# Patient Record
Sex: Male | Born: 2000 | Race: White | Hispanic: No | Marital: Single | Smoking: Current some day smoker
Health system: Southern US, Community
[De-identification: ages and names within clinical notes are randomized; demographics above are authoritative.]

## PROBLEM LIST (undated history)

## (undated) DIAGNOSIS — C799 Secondary malignant neoplasm of unspecified site: Secondary | ICD-10-CM

## (undated) DIAGNOSIS — C439 Malignant melanoma of skin, unspecified: Secondary | ICD-10-CM

## (undated) DIAGNOSIS — T7840XA Allergy, unspecified, initial encounter: Secondary | ICD-10-CM

## (undated) DIAGNOSIS — T4145XA Adverse effect of unspecified anesthetic, initial encounter: Secondary | ICD-10-CM

## (undated) DIAGNOSIS — R112 Nausea with vomiting, unspecified: Secondary | ICD-10-CM

## (undated) DIAGNOSIS — T8859XA Other complications of anesthesia, initial encounter: Secondary | ICD-10-CM

## (undated) DIAGNOSIS — T884XXA Failed or difficult intubation, initial encounter: Secondary | ICD-10-CM

## (undated) DIAGNOSIS — Z9889 Other specified postprocedural states: Secondary | ICD-10-CM

## (undated) HISTORY — PX: CYST REMOVAL WITH BONE GRAFT: SHX6365

---
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
## 2020-11-13 IMAGING — NM NUCLEAR MEDICINE LYMPHANGIOGRAPHY
8 series · 8 of 8 positions shown · non-contrast
Comparison: PET-CT scan 05/09/2018

CLINICAL DATA: Melanoma of the scalp.  Sent 9 mapping

EXAM:
NUCLEAR MEDICINE LYMPHANGIOGRAPHY
TECHNIQUE: Sequential images were obtained following intradermal injection of
radiopharmaceutical at the tumor site in the RIGHT scalp.
RADIOPHARMACEUTICALS:  0.5 mCi millipore-filtered Ic-MMm sulfur
colloid

[Series 1: lymph ant/post · 2.07mm/px · 1 of 1 slices shown]
[im 1/1]
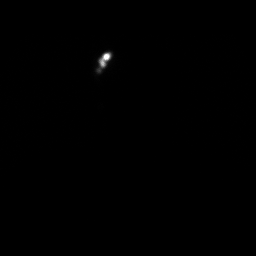

[Series 2: lymph ant/post 2 · 2.07mm/px · 1 of 1 slices shown]
[im 1/1]
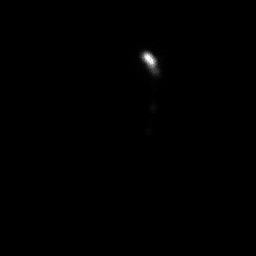

[Series 3: lymph lats · 2.07mm/px · 1 of 1 slices shown]
[im 1/1]
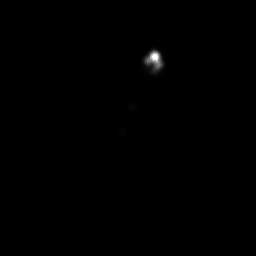

[Series 4: lymph ant/post 3 · 2.07mm/px · 1 of 1 slices shown]
[im 1/1]
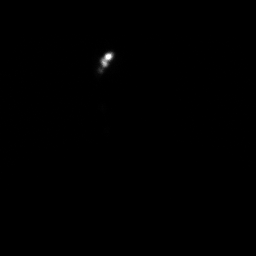

[Series 5: lymph ant/post 4 · 2.07mm/px · 1 of 1 slices shown]
[im 1/1]
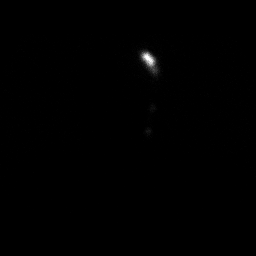

[Series 6: lymph lats 2 · 2.07mm/px · 1 of 1 slices shown]
[im 1/1]
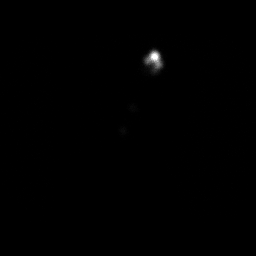

[Series 7: lymph ant/post 5 · 2.07mm/px · 1 of 1 slices shown]
[im 1/1  full-range]
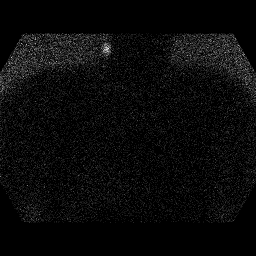

[Series 8: lymph ant/post 6 · 2.07mm/px · 1 of 1 slices shown]
[im 1/1  full-range]
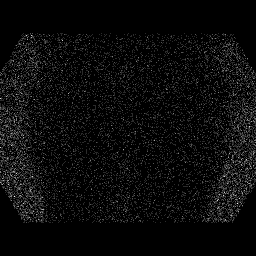

[8 of 8 positions shown; findings below may reference images not displayed]

FINDINGS: Injection site noted in the RIGHT scalp. Lymph channel noted in the
RIGHT lateral neck with two lymph nodes identified along the
channel. One node at the level of the angle of the jaw. The second
more inferior node is RIGHT lower neck superior to the
supraclavicular nodal station. No axillary activity.
IMPRESSION: Two lymph nodes map in the RIGHT lateral neck.

## 2021-02-21 ENCOUNTER — Emergency Department (HOSPITAL_COMMUNITY)
Admission: EM | Admit: 2021-02-21 | Discharge: 2021-02-21 | Discharge status: Against Medical Advice | Payer: Medicaid Other | Source: Home / Self Care

## 2021-02-21 ENCOUNTER — Encounter: Payer: Self-pay | Admitting: Oncology

## 2021-02-22 ENCOUNTER — Emergency Department (HOSPITAL_COMMUNITY): Payer: Medicaid Other

## 2021-02-22 ENCOUNTER — Emergency Department (HOSPITAL_COMMUNITY)
Admission: EM | Admit: 2021-02-22 | Discharge: 2021-02-22 | Disposition: A | Discharge status: Home / Self Care | Payer: Medicaid Other | Attending: Emergency Medicine | Admitting: Emergency Medicine

## 2021-02-22 ENCOUNTER — Encounter (HOSPITAL_COMMUNITY): Payer: Self-pay

## 2021-02-22 DIAGNOSIS — Z79899 Other long term (current) drug therapy: Secondary | ICD-10-CM | POA: Diagnosis not present

## 2021-02-22 DIAGNOSIS — J029 Acute pharyngitis, unspecified: Secondary | ICD-10-CM | POA: Diagnosis not present

## 2021-02-22 DIAGNOSIS — Z85820 Personal history of malignant melanoma of skin: Secondary | ICD-10-CM | POA: Insufficient documentation

## 2021-02-22 DIAGNOSIS — R590 Localized enlarged lymph nodes: Secondary | ICD-10-CM | POA: Insufficient documentation

## 2021-02-22 DIAGNOSIS — F17210 Nicotine dependence, cigarettes, uncomplicated: Secondary | ICD-10-CM | POA: Diagnosis not present

## 2021-02-22 LAB — I-STAT CHEM 8, ED
BUN: 12 mg/dL (ref 6–20)
Calcium, Ion: 1.19 mmol/L (ref 1.15–1.40)
Chloride: 101 mmol/L (ref 98–111)
Creatinine, Ser: 0.8 mg/dL (ref 0.61–1.24)
Glucose, Bld: 92 mg/dL (ref 70–99)
HCT: 48 % (ref 39.0–52.0)
Hemoglobin: 16.3 g/dL (ref 13.0–17.0)
Potassium: 4.1 mmol/L (ref 3.5–5.1)
Sodium: 137 mmol/L (ref 135–145)
TCO2: 27 mmol/L (ref 22–32)

## 2021-02-22 MED ORDER — IOHEXOL 350 MG/ML SOLN
80.0000 mL | Freq: Once | INTRAVENOUS | Status: AC | PRN
  Administered 2021-02-22: 12:00:00 80 mL via INTRAVENOUS

## 2021-02-22 MED ORDER — SODIUM CHLORIDE (PF) 0.9 % IJ SOLN
INTRAMUSCULAR | Status: AC
  Filled 2021-02-22: qty 50

## 2021-02-22 NOTE — Discharge Instructions (Addendum)
The CT scans show you have 1 lymph node in your lungs as well as multiple lymph nodes in the right side of your neck.  You will need to follow-up with the ear nose throat specialist you saw before as well as your oncologist.  Please call them in the next 24 hours to set up outpatient follow-up and work-up/treatment.  If at any point you develop high fever, trouble breathing or swallowing, new or worsening pain, neck swelling or oral swelling, or any other new/concerning symptoms then return to the ER for evaluation.

## 2021-02-22 NOTE — ED Provider Notes (Signed)
[facility] DEPT Provider Note   CSN: [id number] Arrival date & time: 02/22/21  9371     History Chief Complaint  Patient presents with   Sore Throat    Joshua Mata is a 20 y.o. male.  HPI 20 year old male presents with a chief complaint of right-sided sore throat.  He endorses this has been ongoing for about 2 weeks.  He has had a cough for 2 weeks as well that has slowly improved.  He has some chronic right chest discomfort but he feels like that has gotten worse with a cough and wants to make sure there is no pneumonia.  He thought it might of been a tonsil stone but when he touches the back of his throat/tonsil with a tongue blade it caused some bleeding.  He has not had a fever.  He is able to swallow but is very painful.  Currently his pain is moderate.  He has not taken anything for the pain.  In 2020 he had a melanoma removed from his scalp and had a radical neck dissection on the right for lymph nodes. He's not sure if his right neck is swollen compared to baseline.  Past Medical History:  Diagnosis Date   Allergic reaction    unknown cause of face swelling and welts on skin   Complication of anesthesia    Melanoma ([place])    scalp   Metastatic melanoma ([place])    PONV (postoperative nausea and vomiting)     Patient Active Problem List   Diagnosis Date Noted   Metastatic melanoma ([place]) 07/18/2018   Melanoma of skin ([place]) 04/22/2018   Closed nondisplaced fracture of second metatarsal bone of right foot 03/07/2017   Closed nondisplaced fracture of third metatarsal bone of right foot 03/07/2017   Closed displaced fracture of cuboid bone of right foot 03/07/2017   Closed nondisplaced fracture of navicular bone of right foot 03/07/2017    Past Surgical History:  Procedure Laterality Date   APPLICATION OF A-CELL OF HEAD/NECK Right 07/18/2018   Procedure: APPLICATION OF A-CELL OF HEAD/NECK AND CHANGE OF DRESSING ON HEAD MELANOMA;  Surgeon:  Wallace Going, DO;  Location: [facility];  Service: Plastics;  Laterality: Right;   CYST REMOVAL WITH BONE GRAFT     EXCISION MASS HEAD N/A 04/17/2018   Procedure: EXCISION OF MASS OF SCALP;  Surgeon: Wallace Going, DO;  Location: [facility];  Service: Plastics;  Laterality: N/A;  please adjust to 45 min   LYMPH NODE BIOPSY Right 05/22/2018   Procedure: Sentinel node BIOPSY;  Surgeon: Izora Gala, MD;  Location: [facility];  Service: ENT;  Laterality: Right;   MASS EXCISION Right 05/22/2018   Procedure: EXCISION MASS ON SCALP;  Surgeon: Wallace Going, DO;  Location: [facility];  Service: Plastics;  Laterality: Right;   RADICAL NECK DISSECTION Right 07/18/2018   Procedure: Right NECK DISSECTION;  Surgeon: Izora Gala, MD;  Location: [facility];  Service: ENT;  Laterality: Right;       History reviewed. No pertinent family history.  Social History   Tobacco Use   Smoking status: Some Days    Packs/day: 0.15    Years: 2.00    Pack years: 0.30    Types: Cigarettes   Smokeless tobacco: Never   Tobacco comments:    1-2 cig/day  Vaping Use   Vaping Use: Never used  Substance Use Topics   Alcohol use: Yes    Comment: occasional beer  Drug use: No    Home Medications Prior to Admission medications   Medication Sig Start Date End Date Taking? Authorizing Provider  acetaminophen (TYLENOL) 500 MG tablet Take 1,000 mg by mouth every 6 (six) hours as needed for moderate pain.     [provider]  Multiple Vitamin (MULTIVITAMIN WITH MINERALS) TABS tablet Take 1 tablet by mouth daily.    [provider]    Allergies    Patient has no known allergies.  Review of Systems   Review of Systems  Constitutional:  Negative for fever.  HENT:  Positive for postnasal drip and sore throat. Negative for congestion.   Respiratory:  Positive for cough. Negative for shortness of breath.   Cardiovascular:  Positive for chest pain (right lower chest).   Musculoskeletal:  Negative for neck pain.  All other systems reviewed and are negative.  Physical Exam Updated Vital Signs BP 127/63 (BP Location: Left Arm)    Pulse 64    Temp 98.6 F (37 C) (Oral)    Resp 18    SpO2 100%   Physical Exam Vitals and nursing note reviewed.  Constitutional:      General: He is not in acute distress.    Appearance: He is well-developed. He is not ill-appearing or diaphoretic.  HENT:     Head: Normocephalic and atraumatic.     Right Ear: External ear normal.     Left Ear: External ear normal.     Nose: Nose normal.     Mouth/Throat:     Pharynx: Uvula midline. No oropharyngeal exudate or posterior oropharyngeal erythema.     Tonsils: No tonsillar exudate or tonsillar abscesses.     Comments: No obvious oral abnormality. No tonsillar abscess, stone or exudate Eyes:     General:        Right eye: No discharge.        Left eye: No discharge.  Neck:     Comments: No neck tenderness. Perhaps mild swelling to right lateral neck compared to left, but is subtle and unclear if new Cardiovascular:     Rate and Rhythm: Normal rate and regular rhythm.     Heart sounds: Normal heart sounds.  Pulmonary:     Effort: Pulmonary effort is normal.     Breath sounds: Normal breath sounds. No wheezing or rales.  Chest:     Chest wall: No tenderness.  Abdominal:     Palpations: Abdomen is soft.     Tenderness: There is no abdominal tenderness.  Musculoskeletal:     Cervical back: Neck supple.  Skin:    General: Skin is warm and dry.  Neurological:     Mental Status: He is alert.  Psychiatric:        Mood and Affect: Mood is not anxious.    ED Results / Procedures / Treatments   Labs (all labs ordered are listed, but only abnormal results are displayed) Labs Reviewed  I-STAT CHEM 8, ED    EKG None  Radiology DG Chest 2 View  Result Date: 02/22/2021 CLINICAL DATA:  Preop evaluation prior to surgery of the right lateral scalp. EXAM: CHEST - 2 VIEW  COMPARISON:  None. FINDINGS: 6 mm right lower lobe pulmonary nodule. No focal consolidation. No pleural effusion or pneumothorax. Heart and mediastinal contours are unremarkable. No acute osseous abnormality. IMPRESSION: 1. No acute cardiopulmonary disease. 2. A 6 mm right lower lobe pulmonary nodule. Given the patient's history of melanoma, recommend a CT of the chest  if one has not already been performed. Electronically Signed   By: Kathreen Devoid M.D.   On: 02/22/2021 12:07   CT Soft Tissue Neck W Contrast  Result Date: 02/22/2021 CLINICAL DATA:  Epiglottitis or tonsillitis suspected, right-sided throat pain EXAM: CT NECK WITH CONTRAST TECHNIQUE: Multidetector CT imaging of the neck was performed using the standard protocol following the bolus administration of intravenous contrast. CONTRAST:  72mL OMNIPAQUE IOHEXOL 350 MG/ML SOLN COMPARISON:  None. FINDINGS: Pharynx and larynx: No significantly enlargement, hyperenhancement, or focal low-density within the bilateral palatine tonsils. The larynx and pharynx are unremarkable. Salivary glands: The right submandibular gland is surgically absent. No inflammation, mass, or stone. Thyroid: Normal. Lymph nodes: Status post right cervical lymph node dissection. Enlarged right level 3 lymph nodes, the largest of which measures up to 1.9 x 0.9 x 2.1 cm (AP x TR x CC) (series 4, image 56 and series 5, image 39), with some lower density areas at the superior aspect. A slightly more inferior enlarged right level 3 lymph node measures up to 1.4 x 0.7 x 1.7 cm (AP x TR x CC) (series 4, image 65 and series 5, image 42). Prominent right parotid lymph node, which measures 0.6 x 0.9 x 1.2 cm (AP x TR x CC) (series 4, image 29 and series 5, image 53). Prominent left level 2A lymph nodes, the largest of which measures up to 1.1 cm in short axis (series 4, image 34), which retain normal morphology. Vascular: Patent. Limited intracranial: Negative. Visualized orbits: Not included in  the field of view. Mastoids and visualized paranasal sinuses: Minimal mucosal thickening in the ethmoid air cells. The mastoids are well aerated. Skeleton: No acute or aggressive process. Upper chest: Please see same-day CT chest. Other: None. IMPRESSION: 1. Postsurgical changes in the right neck, status post neck dissection, with enlarged right level 3 lymph nodes, the larger of which is somewhat heterogeneously enhancing. 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 Alternatively, consider short-term follow-up in 2-3 months. 2. No evidence of tonsillitis or epiglottitis. Electronically Signed   By: Merilyn Baba M.D.   On: 02/22/2021 13:16   CT Chest W Contrast  Result Date: 02/22/2021 CLINICAL DATA:  Sore throat.  Ongoing for 2 weeks.  Cough. EXAM: CT CHEST WITH CONTRAST TECHNIQUE: Multidetector CT imaging of the chest was performed during intravenous contrast administration. CONTRAST:  37mL OMNIPAQUE IOHEXOL 350 MG/ML SOLN COMPARISON:  None. FINDINGS: Cardiovascular: No significant vascular findings. Normal heart size. No pericardial effusion. Mediastinum/Nodes: No enlarged mediastinal, hilar, or axillary lymph nodes. Thyroid gland, trachea, and esophagus demonstrate no significant findings. Lungs/Pleura: No focal consolidation. 7 mm calcified right lower lobe pulmonary nodule likely reflecting sequela prior granulomatous disease. No pleural effusion or pneumothorax. Upper Abdomen: No acute abnormality. Musculoskeletal: No chest wall abnormality. No acute or significant osseous findings. IMPRESSION: 1. No acute cardiopulmonary disease. 2. No hilar, mediastinal or axillary lymphadenopathy. 3. A 7 mm calcified right lower lobe pulmonary nodule likely reflecting sequela prior granulomatous disease. This finding corresponds with the chest x-ray finding. Electronically Signed   By: Kathreen Devoid M.D.   On:  02/22/2021 12:59    Procedures Procedures   Medications Ordered in ED Medications  iohexol (OMNIPAQUE) 350 MG/ML injection 80 mL (80 mLs Intravenous Contrast Given 02/22/21 1228)  sodium chloride (PF) 0.9 % injection (  Given by Other 02/22/21 1241)    ED Course  I  have reviewed the triage vital signs and the nursing notes.  Pertinent labs & imaging results that were available during my care of the patient were reviewed by me and considered in my medical decision making (see chart for details).    MDM Rules/Calculators/A&P                         Patient overall appears well.  No focal findings on exam.  However given his metastatic melanoma history, CT was obtained, first of neck and then chest was added on because of the finding on his chest x-ray.  Otherwise I think this is amenable to outpatient work-up, will refer back to his oncologist and ear nose throat.  No evidence of deep space infection.  No indication for antibiotics.    Final Clinical Impression(s) / ED Diagnoses Final diagnoses:  Lymphadenopathy, cervical    Rx / DC Orders ED Discharge Orders     None        Sherwood Gambler, MD 02/22/21 1554

## 2021-02-22 NOTE — ED Triage Notes (Signed)
Pt presents with c/o sore throat for approx 2 weeks. Pt also reporting a cough for 2 weeks as well.

## 2021-02-24 ENCOUNTER — Telehealth (HOSPITAL_COMMUNITY): Payer: Self-pay | Admitting: Emergency Medicine

## 2021-02-24 NOTE — Telephone Encounter (Signed)
Ordered outpatient referral to hem/onc at request of scheduler

## 2021-03-29 ENCOUNTER — Other Ambulatory Visit: Payer: Self-pay | Admitting: Otolaryngology

## 2021-03-29 ENCOUNTER — Other Ambulatory Visit (HOSPITAL_COMMUNITY): Payer: Self-pay | Admitting: Otolaryngology

## 2021-04-04 ENCOUNTER — Other Ambulatory Visit (HOSPITAL_COMMUNITY): Payer: Self-pay | Admitting: Otolaryngology

## 2021-04-04 DIAGNOSIS — C434 Malignant melanoma of scalp and neck: Secondary | ICD-10-CM

## 2021-04-05 ENCOUNTER — Encounter (HOSPITAL_COMMUNITY): Payer: Self-pay | Admitting: Radiology

## 2021-04-05 NOTE — Progress Notes (Signed)
Patient Name  Coran, Dipaola Legal Sex  Male DOB  11/12/2000 SSN  [id number] Address  [address]  [place] [address] Phone  [phone] (Home)  [phone] (Mobile)    RE: [name] CORE BIOPSY (LYMPH NODES) Received: Today Greggory Keen, MD  Arlyn Leak for [name] bx rt neck adenopathy.  See recent neck CT   TS        Previous Messages   ----- Message -----  From: Garth Bigness D  Sent: 04/04/2021   5:12 PM EST  To: [facility] Procedure Requests  Subject: [name] CORE BIOPSY (LYMPH NODES)                   Procedure:  [name] CORE BIOPSY (LYMPH NODES)   Reason:  Melanoma of scalp, right neck lymph node of melanoma   History:  CT in computer   Provider: Izora Gala   Provider Contact:  [phone]

## 2021-04-14 ENCOUNTER — Other Ambulatory Visit (HOSPITAL_COMMUNITY): Payer: Self-pay | Admitting: Physician Assistant

## 2021-04-18 ENCOUNTER — Ambulatory Visit (HOSPITAL_COMMUNITY)
Admission: RE | Admit: 2021-04-18 | Discharge: 2021-04-18 | Disposition: A | Discharge status: Home / Self Care | Payer: Medicaid Other | Source: Ambulatory Visit | Attending: Otolaryngology | Admitting: Otolaryngology

## 2021-04-18 ENCOUNTER — Other Ambulatory Visit: Payer: Self-pay

## 2021-04-18 ENCOUNTER — Encounter (HOSPITAL_COMMUNITY): Payer: Self-pay

## 2021-04-18 DIAGNOSIS — R590 Localized enlarged lymph nodes: Secondary | ICD-10-CM | POA: Diagnosis not present

## 2021-04-18 DIAGNOSIS — Z85820 Personal history of malignant melanoma of skin: Secondary | ICD-10-CM | POA: Insufficient documentation

## 2021-04-18 DIAGNOSIS — Z8589 Personal history of malignant neoplasm of other organs and systems: Secondary | ICD-10-CM | POA: Insufficient documentation

## 2021-04-18 DIAGNOSIS — C434 Malignant melanoma of scalp and neck: Secondary | ICD-10-CM | POA: Diagnosis present

## 2021-04-18 MED ORDER — MIDAZOLAM HCL 2 MG/2ML IJ SOLN
INTRAMUSCULAR | Status: AC
  Filled 2021-04-18: qty 2

## 2021-04-18 MED ORDER — SODIUM CHLORIDE 0.9 % IV SOLN: INTRAVENOUS | Status: DC

## 2021-04-18 MED ORDER — MIDAZOLAM HCL 2 MG/2ML IJ SOLN
INTRAMUSCULAR | Status: AC | PRN
  Administered 2021-04-18: 1 mg via INTRAVENOUS
  Administered 2021-04-18: .5 mg via INTRAVENOUS

## 2021-04-18 MED ORDER — LIDOCAINE HCL (PF) 1 % IJ SOLN
INTRAMUSCULAR | Status: AC
  Filled 2021-04-18: qty 30

## 2021-04-18 MED ORDER — FENTANYL CITRATE (PF) 100 MCG/2ML IJ SOLN
INTRAMUSCULAR | Status: AC
  Filled 2021-04-18: qty 2

## 2021-04-18 MED ORDER — FENTANYL CITRATE (PF) 100 MCG/2ML IJ SOLN
INTRAMUSCULAR | Status: AC | PRN
  Administered 2021-04-18: 25 ug via INTRAVENOUS
  Administered 2021-04-18: 50 ug via INTRAVENOUS

## 2021-04-18 NOTE — Procedures (Signed)
Interventional Radiology Procedure Note  Procedure: US Guided Biopsy of right cervical lymph node  Complications: None  Estimated Blood Loss: < 10 mL  Findings: 59 G core biopsy of right cervical LN performed under US guidance.  Four core samples obtained and sent to Pathology.  Venetia Night. Kathlene Cote, M.D Pager:  [phone]

## 2021-04-18 NOTE — H&P (Signed)
Chief Complaint: Patient was seen in consultation today for right neck lymph node biopsy at the request of Rosen,Jefry  Referring Physician(s): La Russell  Supervising Physician: Aletta Edouard  Patient Status: [facility] - Out-pt  History of Present Illness: Joshua Mata is a 21 y.o. male   Hx scalp melanoma 04/2018 Pr developed sore throat and cough December 2022 MD ordered imaging secondary cancer hx  CT Neck 02/22/21:  IMPRESSION: 1. Postsurgical changes in the right neck, status post neck dissection, with enlarged right level 3 lymph nodes, the larger of which is somewhat heterogeneously enhancing. 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 Alternatively, consider short-term follow-up in 2-3 months. 2. No evidence of tonsillitis or epiglottitis.  Was sen by Dr Elie Goody Scheduled here today for Rt neck LAN biopsy  Past Medical History:  Diagnosis Date   Allergic reaction    unknown cause of face swelling and welts on skin   Complication of anesthesia    Melanoma ([place])    scalp   Metastatic melanoma ([place])    PONV (postoperative nausea and vomiting)     Past Surgical History:  Procedure Laterality Date   APPLICATION OF A-CELL OF HEAD/NECK Right 07/18/2018   Procedure: APPLICATION OF A-CELL OF HEAD/NECK AND CHANGE OF DRESSING ON HEAD MELANOMA;  Surgeon: Wallace Going, DO;  Location: [facility];  Service: Plastics;  Laterality: Right;   CYST REMOVAL WITH BONE GRAFT     EXCISION MASS HEAD N/A 04/17/2018   Procedure: EXCISION OF MASS OF SCALP;  Surgeon: Wallace Going, DO;  Location: [facility];  Service: Plastics;  Laterality: N/A;  please adjust to 45 min   LYMPH NODE BIOPSY Right 05/22/2018   Procedure: Sentinel node BIOPSY;  Surgeon: Izora Gala, MD;  Location: [facility];  Service: ENT;  Laterality: Right;   MASS EXCISION Right 05/22/2018    Procedure: EXCISION MASS ON SCALP;  Surgeon: Wallace Going, DO;  Location: [facility];  Service: Plastics;  Laterality: Right;   RADICAL NECK DISSECTION Right 07/18/2018   Procedure: Right NECK DISSECTION;  Surgeon: Izora Gala, MD;  Location: [facility];  Service: ENT;  Laterality: Right;    Allergies: Patient has no known allergies.  Medications: Prior to Admission medications   Not on File     History reviewed. No pertinent family history.  Social History   Socioeconomic History   Marital status: Single    Spouse name: Not on file   Number of children: Not on file   Years of education: Not on file   Highest education level: Not on file  Occupational History   Not on file  Tobacco Use   Smoking status: Some Days    Packs/day: 0.15    Years: 2.00    Pack years: 0.30    Types: Cigarettes   Smokeless tobacco: Never   Tobacco comments:    1-2 cig/day  Vaping Use   Vaping Use: Never used  Substance and Sexual Activity   Alcohol use: Yes    Comment: occasional beer   Drug use: No   Sexual activity: Not on file  Other Topics Concern   Not on file  Social History Narrative   Not on file   Social Determinants of Health   Financial Resource Strain: Not on file  Food Insecurity: Not on file  Transportation Needs: Not on file  Physical Activity: Not on file  Stress: Not on file  Social Connections: Not on file    Review of Systems: A 12 point ROS discussed and pertinent positives are indicated in the HPI above.  All other systems are negative.  Review of Systems  Constitutional:  Negative for activity change, fatigue and fever.  HENT:  Negative for mouth sores and trouble swallowing.   Cardiovascular:  Negative for chest pain.  Gastrointestinal:  Negative for abdominal pain.  Musculoskeletal:  Negative for back pain.  Neurological:  Negative for weakness.  Hematological:  Positive for adenopathy.  Psychiatric/Behavioral:  Negative for behavioral problems and  confusion.    Vital Signs: BP 130/90    Pulse (!) 55    Temp 98.3 F (36.8 C) (Oral)    Resp 16    Ht 5\' 7"  (1.702 m)    Wt 200 lb (90.7 kg)    SpO2 100%    BMI 31.32 kg/m   Physical Exam Vitals reviewed.  HENT:     Mouth/Throat:     Mouth: Mucous membranes are moist.  Cardiovascular:     Rate and Rhythm: Normal rate and regular rhythm.     Heart sounds: Normal heart sounds.  Pulmonary:     Effort: Pulmonary effort is normal.     Breath sounds: Normal breath sounds.  Abdominal:     Palpations: Abdomen is soft.  Musculoskeletal:        General: Normal range of motion.  Skin:    General: Skin is warm.  Neurological:     Mental Status: He is alert and oriented to person, place, and time.  Psychiatric:        Behavior: Behavior normal.    Imaging: No results found.  Labs:  CBC: Recent Labs    02/22/21 1126  HGB 16.3  HCT 48.0    COAGS: No results for input(s): INR, APTT in the last 8760 hours.  BMP: Recent Labs    02/22/21 1126  NA 137  K 4.1  CL 101  GLUCOSE 92  BUN 12  CREATININE 0.80    LIVER FUNCTION TESTS: No results for input(s): BILITOT, AST, ALT, ALKPHOS, PROT, ALBUMIN in the last 8760 hours.  TUMOR MARKERS: No results for input(s): AFPTM, CEA, CA199, CHROMGRNA in the last 8760 hours.  Assessment and Plan:  Hx scalp melanoma 04/2018 with 1/37 nodes + New sore throat and neck pain Dec 2022 CT neck revealing right neck lymph node enlargement Scheduled for biopsy of same today Risks and benefits of right neck lymph node biopsy was discussed with the patient and/or patient's family including, but not limited to bleeding, infection, damage to adjacent structures or low yield requiring additional tests.  All of the questions were answered and there is agreement to proceed. Consent signed and in chart.    Thank you for this interesting consult.  I greatly enjoyed meeting Joshua Mata and look forward to participating in their care.  A copy of  this report was sent to the requesting provider on this date.  Electronically Signed: Lavonia Drafts, PA-C 04/18/2021, 11:44 AM   I spent a total of  30 Minutes   in face to face in clinical consultation, greater than 50% of which was counseling/coordinating care for right neck lymph node biopsy

## 2021-04-19 LAB — SURGICAL PATHOLOGY

## 2021-09-30 ENCOUNTER — Other Ambulatory Visit: Payer: Self-pay
